# Patient Record
Sex: Male | Born: 1950 | Race: White | Hispanic: No | Marital: Married | Smoking: Former smoker
Health system: Southern US, Community
[De-identification: ages and names within clinical notes are randomized; demographics above are authoritative.]

## PROBLEM LIST (undated history)

## (undated) DIAGNOSIS — R059 Cough, unspecified: Secondary | ICD-10-CM

## (undated) DIAGNOSIS — C4490 Unspecified malignant neoplasm of skin, unspecified: Secondary | ICD-10-CM

## (undated) DIAGNOSIS — M1990 Unspecified osteoarthritis, unspecified site: Secondary | ICD-10-CM

## (undated) DIAGNOSIS — C61 Malignant neoplasm of prostate: Secondary | ICD-10-CM

## (undated) DIAGNOSIS — M2550 Pain in unspecified joint: Secondary | ICD-10-CM

## (undated) DIAGNOSIS — R001 Bradycardia, unspecified: Secondary | ICD-10-CM

## (undated) DIAGNOSIS — R05 Cough: Secondary | ICD-10-CM

## (undated) DIAGNOSIS — R361 Hematospermia: Secondary | ICD-10-CM

## (undated) DIAGNOSIS — Z86010 Personal history of colonic polyps: Secondary | ICD-10-CM

## (undated) DIAGNOSIS — E785 Hyperlipidemia, unspecified: Secondary | ICD-10-CM

## (undated) DIAGNOSIS — M25511 Pain in right shoulder: Secondary | ICD-10-CM

## (undated) HISTORY — PX: OTHER SURGICAL HISTORY: SHX169

## (undated) HISTORY — PX: PROSTATE BIOPSY: SHX241

## (undated) HISTORY — PX: APPENDECTOMY: SHX54

---

## 2001-10-31 ENCOUNTER — Emergency Department (HOSPITAL_COMMUNITY): Admission: EM | Admit: 2001-10-31 | Discharge: 2001-10-31 | Payer: Self-pay | Admitting: Emergency Medicine

## 2004-03-15 ENCOUNTER — Ambulatory Visit (HOSPITAL_COMMUNITY): Admission: RE | Admit: 2004-03-15 | Discharge: 2004-03-15 | Payer: Self-pay | Admitting: Orthopedic Surgery

## 2017-04-11 ENCOUNTER — Encounter: Payer: Self-pay | Admitting: Radiation Oncology

## 2017-04-19 ENCOUNTER — Encounter: Payer: Self-pay | Admitting: Radiation Oncology

## 2017-04-19 NOTE — Progress Notes (Signed)
GU Location of Tumor / Histology: prosatic adenocarcinoma  If Prostate Cancer, Gleason Score is (3 + 3) and PSA is (11.1). Prostate volume: 25 g.     Biopsies of prostate (if applicable) revealed:    Past/Anticipated interventions by urology, if any: biopsy, referral to radiation oncology to discuss brachytherapy  Past/Anticipated interventions by medical oncology, if any: no  Weight changes, if any: no  Bowel/Bladder complaints, if any: urinary frequency during the first hour of wakefulness. Denies dysuria, hematuria, leakage, urgency or incontinence.   Nausea/Vomiting, if any: no  Pain issues, if any:  no  SAFETY ISSUES:  Prior radiation? no  Pacemaker/ICD? no  Possible current pregnancy? no  Is the patient on methotrexate? no  Current Complaints / other details:  67 year old male. Married. Adopted. Resides in [place]. Works as a Artist (turns logs into lumber) Diagnosed 04/04/13 but repeat surveillance biopsy on 09/24/15 was negative for malignancy.

## 2017-04-25 ENCOUNTER — Ambulatory Visit
Admission: RE | Admit: 2017-04-25 | Discharge: 2017-04-25 | Disposition: A | Discharge status: Home / Self Care | Payer: Non-veteran care | Source: Ambulatory Visit | Attending: Radiation Oncology | Admitting: Radiation Oncology

## 2017-04-25 ENCOUNTER — Encounter: Payer: Self-pay | Admitting: Radiation Oncology

## 2017-04-25 VITALS — BP 120/72 | HR 50 | Resp 16 | Ht 67.0 in | Wt 199.2 lb

## 2017-04-25 DIAGNOSIS — Z510 Encounter for antineoplastic radiation therapy: Secondary | ICD-10-CM | POA: Diagnosis not present

## 2017-04-25 DIAGNOSIS — Z86010 Personal history of colonic polyps: Secondary | ICD-10-CM | POA: Diagnosis not present

## 2017-04-25 DIAGNOSIS — Z85820 Personal history of malignant melanoma of skin: Secondary | ICD-10-CM | POA: Insufficient documentation

## 2017-04-25 DIAGNOSIS — C61 Malignant neoplasm of prostate: Secondary | ICD-10-CM

## 2017-04-25 DIAGNOSIS — E785 Hyperlipidemia, unspecified: Secondary | ICD-10-CM | POA: Diagnosis not present

## 2017-04-25 DIAGNOSIS — Z87891 Personal history of nicotine dependence: Secondary | ICD-10-CM | POA: Insufficient documentation

## 2017-04-25 HISTORY — DX: Pain in unspecified joint: M25.50

## 2017-04-25 HISTORY — DX: Hyperlipidemia, unspecified: E78.5

## 2017-04-25 HISTORY — DX: Bradycardia, unspecified: R00.1

## 2017-04-25 HISTORY — DX: Pain in right shoulder: M25.511

## 2017-04-25 HISTORY — DX: Malignant neoplasm of prostate: C61

## 2017-04-25 HISTORY — DX: Personal history of colonic polyps: Z86.010

## 2017-04-25 HISTORY — DX: Unspecified malignant neoplasm of skin, unspecified: C44.90

## 2017-04-25 NOTE — Progress Notes (Signed)
Radiation Oncology         [phone] ________________________________  Initial Outpatient Consultation  Name: Lawrnce White MRN: [id number]  Date: 04/25/2017  DOB: December 22, 1950  IO:MBTDHR, Pcp Not In  Butch Penny, MD   REFERRING PHYSICIAN: Butch Penny, MD  DIAGNOSIS: 66 y.o. gentleman with Stage T1c adenocarcinoma of the prostate with Gleason Score of 3+3, and PSA of 11.1    ICD-10-CM   1. Malignant neoplasm of prostate [facility]) [place] Ambulatory referral to Urology    HISTORY OF PRESENT ILLNESS: Dominic White is a 66 y.o. male with a diagnosis of prostate cancer. He was initially diagnosed in October 2014 with low-risk, low volume disease (15%) with a PSA of 7.74 and the patient opted for active surveillance with Dr. Butch Penny at the [facility]. He had a repeat TRUSPBx on 07/30/13 which showed low grade, Gleason 6 disease but with slightly increased volume, at 28%.  His PSA increased to 8.91 06/07/16 which prompted ra repeat TRUSPBx, performed on 09/23/16.  Pathology returned benign prostatic tissue only.  Repeat PSA was 8.67 on 02/15/16 but increased to 11.1 on 02/20/2017, which prompted repeat biopsy and prostate MRI.  The patient proceeded to transrectal ultrasound with 12 biopsies of the prostate on 01/26/2017.  The prostate volume measured 25 cc.  Out of 18 core biopsies, 5 out of 9 core biopsies from the right prostate were positive with evidence of perineural invasion.  The maximum Gleason score was 3+3. MRI Pelvis was performed on 02/27/2017 and showed a 2.0 x 1.2 x 2.4 cm signal abnormality in the right posterior medial/posterior lateral peripheral zone from the apex to the base, considered PIRADS 5. While the lesion appeared to contact the capsule, there was no definite evidence of ECE or metastatic disease in the pelvis.   The patient reviewed the biopsy and imaging results with his urologist and he has kindly been referred today for discussion of potential radiation treatment  options. He is most interested in brachytherapy.   PREVIOUS RADIATION THERAPY: No  PAST MEDICAL HISTORY:  Past Medical History:  Diagnosis Date  . Hx of colonic polyps   . Hyperlipidemia   . Joint pain of leg   . Pain in right shoulder   . Prostate cancer ([place])   . Sinus bradycardia   . Skin cancer    hx of malignant melanoma/left elbow and face      PAST SURGICAL HISTORY: Past Surgical History:  Procedure Laterality Date  . APPENDECTOMY     while in seventh grade  . cyst removed from left inner ear    . PROSTATE BIOPSY      FAMILY HISTORY:  Family History  Problem Relation Age of Onset  . Adopted: Yes    SOCIAL HISTORY:  Social History   Social History  . Marital status: Married    Spouse name: N/A  . Number of children: N/A  . Years of education: N/A   Occupational History  . Not on file.   Social History Main Topics  . Smoking status: Former Smoker    Packs/day: 2.00    Years: 12.00    Types: Cigarettes    Quit date: 06/26/1974  . Smokeless tobacco: Never Used  . Alcohol use No  . Drug use: No  . Sexual activity: Yes   Other Topics Concern  . Not on file   Social History Narrative  . No narrative on file  The patient is married and has young grandchildren that live out of state.  He resides in [place]. He works as a Artist. He is adopted.  ALLERGIES: Codeine and Penicillins  MEDICATIONS:  Current Outpatient Prescriptions  Medication Sig Dispense Refill  . Carboxymethylcellulose Sod PF 0.5 % SOLN Apply to eye. Instill one drop in each eye daily.    . Cholecalciferol (VITAMIN D3) 1000 units CAPS Take by mouth.    . DOCOSAHEXAENOIC ACID PO Take 1 g by mouth.    Marland Kitchen glucosamine-chondroitin 500-400 MG tablet Take 1 tablet by mouth 3 (three) times daily.    . Multiple Vitamin (MULTIVITAMIN) tablet Take 1 tablet by mouth daily.    . naproxen (NAPROSYN) 500 MG tablet Take 500 mg by mouth 2 (two) times daily with a meal.    . Omega-3 Fatty Acids (FISH  OIL) 1000 MG CAPS Take by mouth.     No current facility-administered medications for this encounter.     REVIEW OF SYSTEMS:  On review of systems, the patient reports that he is doing well overall. He denies any chest pain, shortness of breath, cough, fevers, chills, night sweats, or unintended weight changes. He denies any bowel disturbances, and denies abdominal pain, nausea or vomiting. He denies any new musculoskeletal or joint aches or pains. His IPSS was 3, indicating mild urinary symptoms, including urinary frequency during the first hour of wakefulness. He denies any dysuria, hematuria, urgency, leakage or incontinence. He is able to complete sexual activity with all attempts. A complete review of systems is obtained and is otherwise negative.    PHYSICAL EXAM:  Wt Readings from Last 3 Encounters:  04/25/17 199 lb 3.2 oz (90.4 kg)   Temp Readings from Last 3 Encounters:  No data found for Temp   BP Readings from Last 3 Encounters:  04/25/17 120/72   Pulse Readings from Last 3 Encounters:  04/25/17 (!) 50   Pain Assessment Pain Score: 0-No pain/10  In general this is a well appearing Caucasian man in no acute distress. He is alert and oriented x4 and appropriate throughout the examination. HEENT reveals that the patient is normocephalic, atraumatic. Cardiovascular exam reveals a regular rate and rhythm, no clicks rubs or murmurs are auscultated. Chest is clear to auscultation bilaterally. Abdomen has active bowel sounds in all quadrants and is intact. The abdomen is soft, non tender, non distended.Lymphatic exam is negative for lymphadenopathy in the cervical or supraclavicular chains.  Lower extremities are negative for pretibial pitting edema, deep calf tenderness, cyanosis or clubbing.   KPS = 100  100 - Normal; no complaints; no evidence of disease. 90   - Able to carry on normal activity; minor signs or symptoms of disease. 80   - Normal activity with effort; some signs or  symptoms of disease. 62   - Cares for self; unable to carry on normal activity or to do active work. 60   - Requires occasional assistance, but is able to care for most of his personal needs. 50   - Requires considerable assistance and frequent medical care. 13   - Disabled; requires special care and assistance. 23   - Severely disabled; hospital admission is indicated although death not imminent. 38   - Very sick; hospital admission necessary; active supportive treatment necessary. 10   - Moribund; fatal processes progressing rapidly. 0     - Dead  Karnofsky DA, Abelmann WH, Craver LS and Burchenal [facility] [phone]) The use of the nitrogen mustards in the palliative treatment of carcinoma: with particular reference to bronchogenic carcinoma Cancer 1 [phone]  LABORATORY DATA:  No results found for: WBC, HGB, HCT, MCV, PLT No results found for: NA, K, CL, CO2 No results found for: ALT, AST, GGT, ALKPHOS, BILITOT   RADIOGRAPHY: No results found.    IMPRESSION/PLAN: 1. 66 y.o. gentleman with Stage T1c adenocarcinoma of the prostate with Gleason Score of 3+3, and PSA of 11.1. We discussed the patient's workup and outlined the nature of prostate cancer in this setting. The patient's T stage, Gleason's score, and PSA put him into the intermediate risk group. Accordingly, he is eligible for a variety of potential treatment options including brachytherapy, 8 weeks of external radiation or 5 weeks of external radiation followed by a brachytherapy boost. We discussed the available radiation techniques, and focused on the details of logistics and delivery. We discussed and outlined the risks, benefits, short and long-term effects associated with radiotherapy and compared and contrasted these with prostatectomy.  The patient expressed interest in prostate brachytherapy.   The patient is scheduled for repeat PSA and follow up with Dr. Amalia Hailey in January 2019. He would like to proceed with prostate brachytherapy if  his PSA remains elevated at that time. In the meantime, we will request a consultation with Dr. Nila Nephew in [place] in preparation to move forward with scheduling the procedure in the future, given that the patient wishes to proceed. The patient met briefly with Romie Jumper in our office who will be working closely with him to coordinate OR scheduling and pre and post procedure appointments.   We enjoyed meeting with him today, and will look forward to participating in the care of this very nice gentleman.  We spent 50 minutes face to face with the patient and more than 50% of that time was spent in counseling and/or coordination of care.    Nicholos Johns, PA-C    Tyler Pita, MD  [facility] Direct Dial: [phone]  Fax: [phone] [email or web address]  Skype  [email or web address]    This document serves as a record of services personally performed by Tyler Pita, MD and Brennen Caldron, PA-C. It was created on their behalf by Rae Lips, a trained medical scribe. The creation of this record is based on the scribe's personal observations and the providers' statements to them. This document has been checked and approved by the attending providers.

## 2017-04-25 NOTE — Progress Notes (Signed)
See progress note under physician encounter. 

## 2017-04-30 ENCOUNTER — Telehealth: Payer: Self-pay | Admitting: Medical Oncology

## 2017-04-30 NOTE — Telephone Encounter (Signed)
I called patient to introduce myself as the nurse navigator and my role. I was unable to meet Dominic White 10/31 when he consulted with Dr. Tammi Klippel. He is scheduled to have a PSA check in January 2019 and if it remains elevated he will be scheduled for brachytherapy. I asked him to call me with questions or concerns.

## 2017-05-07 ENCOUNTER — Telehealth: Payer: Self-pay | Admitting: *Deleted

## 2017-05-07 NOTE — Telephone Encounter (Signed)
[facility] 05-07-17 [facility] Aldona Lento

## 2017-06-08 ENCOUNTER — Encounter: Payer: Self-pay | Admitting: Urology

## 2017-06-08 NOTE — Progress Notes (Addendum)
Patient had a consult with Dr. Nila Nephew on 06/07/2017 and was felt to be a good candidate for brachytherapy. He has a follow-up appointment scheduled with Dr. Rosana Hoes in January 2019 for a repeat PSA. He is interested in moving forward with brachytherapy if his PSA remains elevated at that time. I will plan to follow-up with him in January and then will assist in making appropriate arrangements at that time if he is ready to proceed.   Nicholos Johns, PA-C

## 2017-06-22 ENCOUNTER — Telehealth: Payer: Self-pay | Admitting: *Deleted

## 2017-06-22 NOTE — Telephone Encounter (Signed)
CALLED PATIENT TO INFORM OF PRE-SEED PLANNING CT FOR 06-29-17 AND HIS IMPLANT ON 08-16-17 @ 2 PM, SPOKE WITH PATIENT AND HE IS AWARE OF THESE APPTS.

## 2017-06-28 ENCOUNTER — Telehealth: Payer: Self-pay | Admitting: *Deleted

## 2017-06-28 NOTE — Telephone Encounter (Signed)
Called patient to remind of pre-seed appts. for 06-29-17, spoke with patient and he is aware of these appts.

## 2017-06-28 NOTE — Progress Notes (Signed)
  Radiation Oncology         [phone]) [phone] ________________________________  Name: Dominic White MRN: [id number]  Date: 06/29/2017  DOB: 07-10-1950  SIMULATION AND TREATMENT PLANNING NOTE PUBIC ARCH STUDY  DZ:HGDJME, Pcp Not In  Butch Penny, MD  DIAGNOSIS: 67 y.o. gentleman with Stage T1c adenocarcinoma of the prostate with Gleason Score of 3+3, and PSA of 11.1     ICD-10-CM   1. Malignant neoplasm of prostate ([place]) C61     COMPLEX SIMULATION:  The patient presented today for evaluation for possible prostate seed implant. He was brought to the radiation planning suite and placed supine on the CT couch. A 3-dimensional image study set was obtained in upload to the planning computer. There, on each axial slice, I contoured the prostate gland. Then, using three-dimensional radiation planning tools I reconstructed the prostate in view of the structures from the transperineal needle pathway to assess for possible pubic arch interference. In doing so, I did not appreciate any pubic arch interference. Also, the patient's prostate volume was estimated based on the drawn structure. The volume was 34 cc.  Given the pubic arch appearance and prostate volume, patient remains a good candidate to proceed with prostate seed implant. Today, he freely provided informed written consent to proceed.    PLAN: The patient will undergo prostate seed implant to 145 Gy with curative intent.   ________________________________  Sheral Apley. Tammi Klippel, M.D.

## 2017-06-29 ENCOUNTER — Ambulatory Visit
Admission: RE | Admit: 2017-06-29 | Discharge: 2017-06-29 | Disposition: A | Discharge status: Home / Self Care | Payer: Non-veteran care | Source: Ambulatory Visit | Attending: Radiation Oncology | Admitting: Radiation Oncology

## 2017-06-29 ENCOUNTER — Encounter: Payer: Self-pay | Admitting: Medical Oncology

## 2017-06-29 ENCOUNTER — Encounter (HOSPITAL_COMMUNITY)
Admission: RE | Admit: 2017-06-29 | Discharge: 2017-06-29 | Disposition: A | Discharge status: Home / Self Care | Payer: Non-veteran care | Source: Ambulatory Visit | Attending: Urology | Admitting: Urology

## 2017-06-29 ENCOUNTER — Ambulatory Visit (HOSPITAL_COMMUNITY)
Admission: RE | Admit: 2017-06-29 | Discharge: 2017-06-29 | Disposition: A | Discharge status: Home / Self Care | Payer: Non-veteran care | Source: Ambulatory Visit | Attending: Urology | Admitting: Urology

## 2017-06-29 DIAGNOSIS — Z01818 Encounter for other preprocedural examination: Secondary | ICD-10-CM | POA: Diagnosis present

## 2017-06-29 DIAGNOSIS — C61 Malignant neoplasm of prostate: Secondary | ICD-10-CM

## 2017-06-29 DIAGNOSIS — Z86010 Personal history of colonic polyps: Secondary | ICD-10-CM | POA: Diagnosis not present

## 2017-06-29 DIAGNOSIS — Z87891 Personal history of nicotine dependence: Secondary | ICD-10-CM | POA: Diagnosis not present

## 2017-06-29 DIAGNOSIS — Z85820 Personal history of malignant melanoma of skin: Secondary | ICD-10-CM | POA: Diagnosis not present

## 2017-06-29 DIAGNOSIS — E785 Hyperlipidemia, unspecified: Secondary | ICD-10-CM | POA: Diagnosis not present

## 2017-06-29 DIAGNOSIS — Z510 Encounter for antineoplastic radiation therapy: Secondary | ICD-10-CM | POA: Diagnosis not present

## 2017-08-02 ENCOUNTER — Encounter (HOSPITAL_BASED_OUTPATIENT_CLINIC_OR_DEPARTMENT_OTHER): Payer: Self-pay | Admitting: *Deleted

## 2017-08-02 ENCOUNTER — Other Ambulatory Visit: Payer: Self-pay

## 2017-08-02 NOTE — Progress Notes (Addendum)
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  PATIENT REPORTS DRY COUGH X 1 DAY, PATIENT INSTRUCTED TO GO TO PRIMARY MD IF COUGH GETS WORSE OR CHEST CONGESTION OR FEVER, PATIENT REPORTS BLOOD IN SEMEN FOR LAST WEEK, PATIENT STATED HE CALLED DOCTOR AT [place] AND WAS TOLD THIS COULD BE FROM LAST PROSTATE BIOPSY, PATIENT INSTRUCTED TO CALL DOCTOR [place] AND LET HIM KNOW IF BLOOD IN SEMEN NOT CLEARED UP BY Monday 08-06-17.

## 2017-08-15 ENCOUNTER — Telehealth: Payer: Self-pay | Admitting: *Deleted

## 2017-08-15 NOTE — Telephone Encounter (Signed)
CALLED PATIENT TO REMIND OF PROCEDURE FOR 08-16-17, SPOKE WITH PATIENT AND HE IS AWARE OF THIS PROCEDURE

## 2017-08-16 ENCOUNTER — Ambulatory Visit (HOSPITAL_BASED_OUTPATIENT_CLINIC_OR_DEPARTMENT_OTHER)
Admission: RE | Admit: 2017-08-16 | Discharge: 2017-08-16 | Disposition: A | Discharge status: Home / Self Care | Payer: Non-veteran care | Source: Ambulatory Visit | Attending: Urology | Admitting: Urology

## 2017-08-16 ENCOUNTER — Ambulatory Visit (HOSPITAL_BASED_OUTPATIENT_CLINIC_OR_DEPARTMENT_OTHER): Payer: Non-veteran care | Admitting: Anesthesiology

## 2017-08-16 ENCOUNTER — Ambulatory Visit (HOSPITAL_COMMUNITY): Payer: Non-veteran care

## 2017-08-16 ENCOUNTER — Encounter (HOSPITAL_BASED_OUTPATIENT_CLINIC_OR_DEPARTMENT_OTHER): Payer: Self-pay | Admitting: *Deleted

## 2017-08-16 ENCOUNTER — Encounter (HOSPITAL_BASED_OUTPATIENT_CLINIC_OR_DEPARTMENT_OTHER)
Admission: RE | Disposition: A | Discharge status: Home / Self Care | Payer: Self-pay | Source: Ambulatory Visit | Attending: Urology

## 2017-08-16 DIAGNOSIS — Z79899 Other long term (current) drug therapy: Secondary | ICD-10-CM | POA: Diagnosis not present

## 2017-08-16 DIAGNOSIS — C61 Malignant neoplasm of prostate: Secondary | ICD-10-CM | POA: Diagnosis present

## 2017-08-16 DIAGNOSIS — E785 Hyperlipidemia, unspecified: Secondary | ICD-10-CM | POA: Diagnosis not present

## 2017-08-16 DIAGNOSIS — Z885 Allergy status to narcotic agent status: Secondary | ICD-10-CM | POA: Insufficient documentation

## 2017-08-16 DIAGNOSIS — Z880 Allergy status to penicillin: Secondary | ICD-10-CM | POA: Insufficient documentation

## 2017-08-16 DIAGNOSIS — Z85820 Personal history of malignant melanoma of skin: Secondary | ICD-10-CM | POA: Insufficient documentation

## 2017-08-16 DIAGNOSIS — Z87891 Personal history of nicotine dependence: Secondary | ICD-10-CM | POA: Diagnosis not present

## 2017-08-16 DIAGNOSIS — Z01818 Encounter for other preprocedural examination: Secondary | ICD-10-CM

## 2017-08-16 HISTORY — PX: RADIOACTIVE SEED IMPLANT: SHX5150

## 2017-08-16 HISTORY — PX: SPACE OAR INSTILLATION: SHX6769

## 2017-08-16 HISTORY — DX: Unspecified osteoarthritis, unspecified site: M19.90

## 2017-08-16 HISTORY — PX: CYSTOSCOPY: SHX5120

## 2017-08-16 HISTORY — DX: Cough, unspecified: R05.9

## 2017-08-16 HISTORY — DX: Hematospermia: R36.1

## 2017-08-16 HISTORY — DX: Cough: R05

## 2017-08-16 SURGERY — INSERTION, RADIATION SOURCE, PROSTATE
Anesthesia: General

## 2017-08-16 MED ORDER — FENTANYL CITRATE (PF) 100 MCG/2ML IJ SOLN
INTRAMUSCULAR | Status: AC
  Filled 2017-08-16: qty 2

## 2017-08-16 MED ORDER — PROMETHAZINE HCL 25 MG/ML IJ SOLN
6.2500 mg | INTRAMUSCULAR | Status: DC | PRN
  Filled 2017-08-16: qty 1

## 2017-08-16 MED ORDER — KETOROLAC TROMETHAMINE 30 MG/ML IJ SOLN
INTRAMUSCULAR | Status: DC | PRN
  Administered 2017-08-16: 30 mg via INTRAVENOUS

## 2017-08-16 MED ORDER — DEXAMETHASONE SODIUM PHOSPHATE 10 MG/ML IJ SOLN
INTRAMUSCULAR | Status: AC
Start: 2017-08-16 — End: 2017-08-16
  Filled 2017-08-16: qty 1

## 2017-08-16 MED ORDER — ONDANSETRON HCL 4 MG/2ML IJ SOLN
INTRAMUSCULAR | Status: AC
  Filled 2017-08-16: qty 2

## 2017-08-16 MED ORDER — PROPOFOL 10 MG/ML IV BOLUS
INTRAVENOUS | Status: AC
  Filled 2017-08-16: qty 20

## 2017-08-16 MED ORDER — PHENYLEPHRINE 40 MCG/ML (10ML) SYRINGE FOR IV PUSH (FOR BLOOD PRESSURE SUPPORT)
PREFILLED_SYRINGE | INTRAVENOUS | Status: AC
Start: 2017-08-16 — End: 2017-08-16
  Filled 2017-08-16: qty 10

## 2017-08-16 MED ORDER — FENTANYL CITRATE (PF) 100 MCG/2ML IJ SOLN
25.0000 ug | INTRAMUSCULAR | Status: DC | PRN
  Administered 2017-08-16: 25 ug via INTRAVENOUS
  Filled 2017-08-16: qty 1

## 2017-08-16 MED ORDER — CIPROFLOXACIN IN D5W 400 MG/200ML IV SOLN
INTRAVENOUS | Status: AC
  Filled 2017-08-16: qty 200

## 2017-08-16 MED ORDER — LACTATED RINGERS IV SOLN
INTRAVENOUS | Status: DC
  Administered 2017-08-16 (×2): via INTRAVENOUS
  Filled 2017-08-16: qty 1000

## 2017-08-16 MED ORDER — OXYCODONE HCL 5 MG/5ML PO SOLN
5.0000 mg | Freq: Once | ORAL | Status: DC | PRN
  Filled 2017-08-16: qty 5

## 2017-08-16 MED ORDER — CIPROFLOXACIN IN D5W 400 MG/200ML IV SOLN
400.0000 mg | INTRAVENOUS | Status: AC
  Administered 2017-08-16: 400 mg via INTRAVENOUS
  Filled 2017-08-16: qty 200

## 2017-08-16 MED ORDER — EPHEDRINE 5 MG/ML INJ
INTRAVENOUS | Status: AC
  Filled 2017-08-16: qty 10

## 2017-08-16 MED ORDER — DEXAMETHASONE SODIUM PHOSPHATE 10 MG/ML IJ SOLN
INTRAMUSCULAR | Status: DC | PRN
  Administered 2017-08-16: 10 mg via INTRAVENOUS

## 2017-08-16 MED ORDER — LIDOCAINE 2% (20 MG/ML) 5 ML SYRINGE
INTRAMUSCULAR | Status: AC
  Filled 2017-08-16: qty 5

## 2017-08-16 MED ORDER — EPHEDRINE SULFATE 50 MG/ML IJ SOLN
INTRAMUSCULAR | Status: DC | PRN
  Administered 2017-08-16: 10 mg via INTRAVENOUS
  Administered 2017-08-16: 5 mg via INTRAVENOUS
  Administered 2017-08-16 (×2): 10 mg via INTRAVENOUS

## 2017-08-16 MED ORDER — MIDAZOLAM HCL 2 MG/2ML IJ SOLN
INTRAMUSCULAR | Status: AC
  Filled 2017-08-16: qty 2

## 2017-08-16 MED ORDER — ONDANSETRON HCL 4 MG/2ML IJ SOLN
INTRAMUSCULAR | Status: DC | PRN
  Administered 2017-08-16: 4 mg via INTRAVENOUS

## 2017-08-16 MED ORDER — FENTANYL CITRATE (PF) 100 MCG/2ML IJ SOLN
25.0000 ug | INTRAMUSCULAR | Status: DC | PRN
  Filled 2017-08-16: qty 1

## 2017-08-16 MED ORDER — PROPOFOL 10 MG/ML IV BOLUS
INTRAVENOUS | Status: DC | PRN
  Administered 2017-08-16: 200 mg via INTRAVENOUS

## 2017-08-16 MED ORDER — OXYCODONE HCL 5 MG PO TABS
5.0000 mg | ORAL_TABLET | Freq: Once | ORAL | Status: DC | PRN
  Filled 2017-08-16: qty 1

## 2017-08-16 MED ORDER — ONDANSETRON HCL 4 MG/2ML IJ SOLN
4.0000 mg | Freq: Once | INTRAMUSCULAR | Status: DC | PRN
  Filled 2017-08-16: qty 2

## 2017-08-16 MED ORDER — LIDOCAINE 2% (20 MG/ML) 5 ML SYRINGE
INTRAMUSCULAR | Status: DC | PRN
  Administered 2017-08-16: 100 mg via INTRAVENOUS

## 2017-08-16 MED ORDER — FLEET ENEMA 7-19 GM/118ML RE ENEM
1.0000 | ENEMA | Freq: Once | RECTAL | Status: AC
  Administered 2017-08-16: 1 via RECTAL
  Filled 2017-08-16: qty 1

## 2017-08-16 MED ORDER — KETOROLAC TROMETHAMINE 30 MG/ML IJ SOLN
30.0000 mg | Freq: Once | INTRAMUSCULAR | Status: DC | PRN
  Filled 2017-08-16: qty 1

## 2017-08-16 MED ORDER — SODIUM CHLORIDE 0.9 % IJ SOLN
INTRAMUSCULAR | Status: DC | PRN
  Administered 2017-08-16: 10 mL

## 2017-08-16 MED ORDER — IOHEXOL 300 MG/ML  SOLN
INTRAMUSCULAR | Status: DC | PRN
  Administered 2017-08-16: 5 mL

## 2017-08-16 MED ORDER — MIDAZOLAM HCL 2 MG/2ML IJ SOLN
INTRAMUSCULAR | Status: DC | PRN
  Administered 2017-08-16: 2 mg via INTRAVENOUS

## 2017-08-16 MED ORDER — FENTANYL CITRATE (PF) 100 MCG/2ML IJ SOLN
INTRAMUSCULAR | Status: DC | PRN
  Administered 2017-08-16 (×3): 25 ug via INTRAVENOUS

## 2017-08-16 MED FILL — levoFLOXacin 500 MG TABS: 500 | 6 days supply | Qty: 3 | Fill #0

## 2017-08-16 MED FILL — HYDROmorphone HCL 2 MG TABS: 2 | 2 days supply | Qty: 10 | Fill #0

## 2017-08-16 MED FILL — TAMSULOSIN HCL 0.4 MG CAP: 0.4 | 30 days supply | Qty: 30 | Fill #0

## 2017-08-16 SURGICAL SUPPLY — 25 items
BLADE CLIPPER SURG (BLADE) ×4 IMPLANT
CATH FOLEY 2WAY SLVR  5CC 16FR (CATHETERS) ×4
CATH FOLEY 2WAY SLVR 5CC 16FR (CATHETERS) ×4 IMPLANT
CATH ROBINSON RED A/P 20FR (CATHETERS) ×4 IMPLANT
CLOTH BEACON ORANGE TIMEOUT ST (SAFETY) ×4 IMPLANT
COVER BACK TABLE 60X90IN (DRAPES) ×4 IMPLANT
COVER MAYO STAND STRL (DRAPES) ×4 IMPLANT
DRSG TEGADERM 4X4.75 (GAUZE/BANDAGES/DRESSINGS) ×4 IMPLANT
DRSG TEGADERM 8X12 (GAUZE/BANDAGES/DRESSINGS) ×4 IMPLANT
GAUZE SPONGE 4X4 8PLY STR LF (GAUZE/BANDAGES/DRESSINGS) ×4 IMPLANT
GLOVE ECLIPSE 8.0 STRL XLNG CF (GLOVE) ×12 IMPLANT
GLOVE SURG SIGNA 7.5 PF LTX (GLOVE) ×4 IMPLANT
GOWN SPEC L3 MED W/TWL (GOWN DISPOSABLE) ×4 IMPLANT
HOLDER FOLEY CATH W/STRAP (MISCELLANEOUS) ×4 IMPLANT
IMPL SPACEOAR SYSTEM 10ML (MISCELLANEOUS) ×2 IMPLANT
IMPLANT SPACEOAR SYSTEM 10ML (MISCELLANEOUS) ×4
IV NS 1000ML (IV SOLUTION) ×2
IV NS 1000ML BAXH (IV SOLUTION) ×2 IMPLANT
KIT RM TURNOVER CYSTO AR (KITS) ×4 IMPLANT
PACK CYSTO (CUSTOM PROCEDURE TRAY) ×4 IMPLANT
SUT BONE WAX W31G (SUTURE) ×4 IMPLANT
SYRINGE 10CC LL (SYRINGE) ×8 IMPLANT
UNDERPAD 30X30 INCONTINENT (UNDERPADS AND DIAPERS) ×8 IMPLANT
WATER STERILE IRR 500ML POUR (IV SOLUTION) ×4 IMPLANT
selectSeed I-125 ×4 IMPLANT

## 2017-08-16 NOTE — Anesthesia Postprocedure Evaluation (Signed)
Anesthesia Post Note  Patient: Dominic White  Procedure(s) Performed: RADIOACTIVE SEED IMPLANT/BRACHYTHERAPY IMPLANT (N/A ) CYSTOSCOPY SPACE OAR INSTILLATION     Patient location during evaluation: PACU Anesthesia Type: General Level of consciousness: awake and alert Pain management: pain level controlled Vital Signs Assessment: post-procedure vital signs reviewed and stable Respiratory status: spontaneous breathing, nonlabored ventilation, respiratory function stable and patient connected to nasal cannula oxygen Cardiovascular status: blood pressure returned to baseline and stable Postop Assessment: no apparent nausea or vomiting Anesthetic complications: no    Last Vitals:  Vitals:   08/16/17 1630 08/16/17 1640  BP: 139/78   Pulse: 67 65  Resp: 16 18  Temp:    SpO2: 97% 94%    Last Pain:  Vitals:   08/16/17 1609  TempSrc:   PainSc: Asleep                 Jaken Fregia S

## 2017-08-16 NOTE — Anesthesia Preprocedure Evaluation (Addendum)
Anesthesia Evaluation  Patient identified by MRN, date of birth, ID band Patient awake    Reviewed: Allergy & Precautions, NPO status , Patient's Chart, lab work & pertinent test results  Airway Mallampati: II  TM Distance: >3 FB Neck ROM: Full    Dental  (+) Dental Advisory Given, Missing, Teeth Intact,    Pulmonary former smoker,    Pulmonary exam normal breath sounds clear to auscultation       Cardiovascular negative cardio ROS Normal cardiovascular exam Rhythm:Regular Rate:Normal     Neuro/Psych negative neurological ROS  negative psych ROS   GI/Hepatic negative GI ROS, Neg liver ROS,   Endo/Other  Obesity  Renal/GU negative Renal ROS   Prostate cancer    Musculoskeletal  (+) Arthritis ,   Abdominal   Peds  Hematology negative hematology ROS (+)   Anesthesia Other Findings   Reproductive/Obstetrics                            Anesthesia Physical Anesthesia Plan  ASA: II  Anesthesia Plan: General   Post-op Pain Management:    Induction: Intravenous  PONV Risk Score and Plan: 3 and Treatment may vary due to age or medical condition, Dexamethasone and Ondansetron  Airway Management Planned: LMA  Additional Equipment: None  Intra-op Plan:   Post-operative Plan: Extubation in OR  Informed Consent: I have reviewed the patients History and Physical, chart, labs and discussed the procedure including the risks, benefits and alternatives for the proposed anesthesia with the patient or authorized representative who has indicated his/her understanding and acceptance.   Dental advisory given  Plan Discussed with: CRNA  Anesthesia Plan Comments:         Anesthesia Quick Evaluation

## 2017-08-16 NOTE — Transfer of Care (Signed)
Immediate Anesthesia Transfer of Care Note  Patient: Dominic White  Procedure(s) Performed: RADIOACTIVE SEED IMPLANT/BRACHYTHERAPY IMPLANT (N/A ) CYSTOSCOPY SPACE OAR INSTILLATION  Patient Location: PACU  Anesthesia Type:General  Level of Consciousness: awake, alert , oriented and patient cooperative  Airway & Oxygen Therapy: Patient Spontanous Breathing and Patient connected to nasal cannula oxygen  Post-op Assessment: Report given to RN and Post -op Vital signs reviewed and stable  Post vital signs: Reviewed and stable  Last Vitals:  Vitals:   08/16/17 1200 08/16/17 1609  BP: 117/66   Pulse: (!) 49 (P) 74  Resp: 16   Temp: 36.8 C (P) 36.6 C  SpO2: 99% (P) 96%    Last Pain:  Vitals:   08/16/17 1200  TempSrc: Oral      Patients Stated Pain Goal: 7 (55/37/48 2707)  Complications: No apparent anesthesia complications

## 2017-08-16 NOTE — Interval H&P Note (Signed)
History and Physical Interval Note:  08/16/2017 2:32 PM  Dominic White  has presented today for surgery, with the diagnosis of prostate cancer  The various methods of treatment have been discussed with the patient and family. After consideration of risks, benefits and other options for treatment, the patient has consented to  Procedure(s) with comments: RADIOACTIVE SEED IMPLANT/BRACHYTHERAPY IMPLANT (N/A) -  seeds implanted CYSTOSCOPY - no seeds noted in bladder  as a surgical intervention .  The patient's history has been reviewed, patient examined, no change in status, stable for surgery.  I have reviewed the patient's chart and labs.  Questions were answered to the patient's satisfaction.     Merelyn Klump

## 2017-08-16 NOTE — H&P (Signed)
NAME:  Dominic White, Dominic White NO.:  MEDICAL RECORD NO.:  [id number]  LOCATION:                                 FACILITY:  PHYSICIAN:  Joie Bimler, M.D.          DATE OF BIRTH:  DATE OF ADMISSION: DATE OF DISCHARGE:                             HISTORY & PHYSICAL   For procedure on August 16, 2017.  The patient is a 67 year old white male with a history of prostate cancer.  He was initially diagnosed in October 2014 with low volume disease and was monitored.  Over the ensuing years, he had a slowly rising PSA such that a repeat PSA in 2018 had increased to 11.1.  This prompted a repeat biopsy and prostate MRI.  The patient was found to have 5/9 core biopsies from the right prostate that were positive. Maximum Gleason score was 3+3.  MRI of the pelvis was performed and it showed a lesion in the right posteromedial, posterolateral peripheral zone.  There was no evidence of extracapsular extension or metastatic disease in the pelvis.  The patient has reviewed his options and has opted for brachytherapy for definitive treatment of the prostate cancer.  PAST MEDICAL HISTORY:  Significant for history of colonic polyps. History of hyperlipidemia.  Degenerative joint disease.  He has a history of malignant melanoma of the left elbow and face.  Status post excision.  He had previous appendectomy many years ago.  SOCIAL HISTORY:  He is a former smoker.  He smoked 2 packs per day for 12 years; he stopped in 1976.  ALLERGIES:  He is allergic to: 1. CODEINE. 2. PENICILLIN.  CURRENT MEDICATIONS:  Re listed in the chart and include: 1. Carboxymethylcellulose eye drops. 2. Vitamin D3. 3. Glucosamine. 4. Multivitamins. 5. Naproxen p.r.n. 6. Omega-3 fatty acid capsular supplement.  FAMILY HISTORY:  Not significant for any history of prostate cancer.  REVIEW OF SYSTEMS:  Shows no history of chest pain, shortness of breath, cough, fever, or chills.  He has maintained  his weight.  PHYSICAL EXAMINATION:  GENERAL:  Well-developed, well-nourished man, in no acute distress. HEAD:  Normocephalic. NECK:  Supple without masses.  No lymphadenopathy palpable in the supraclavicular area. LUNGS:  Clear to auscultation. HEART:  Sounds regular rate and rhythm. ABDOMEN:  Soft, nontender.  Mild distention. GU:  The penis is without lesions.  Both testes descended. RECTAL:  Reveals a 25-g prostate.  Mild firmness at the right base. Adequate sphincter tone. EXTREMITIES:  Without clubbing or edema.  No calf tenderness noted. NEUROLOGICAL:  Nonfocal.  LABORATORY AND IMAGING DATA:  Pertinent laboratories are pending, include lab data.  Chest x-ray is negative for any infiltrate.  IMPRESSION:  Stage T1c to T2 adenocarcinoma of the prostate.  PLAN:  Options have been discussed for definitive treatment and the patient has opted for radioactive seed implantation for therapy.  The risks and benefits have been discussed and he is willing to proceed. Questions have been answered to his satisfaction.          ______________________________ Joie Bimler, M.D.     RC/MEDQ  D:  08/15/2017  T:  08/16/2017  Job:  [id number] 

## 2017-08-16 NOTE — Anesthesia Procedure Notes (Signed)
Procedure Name: LMA Insertion Date/Time: 08/16/2017 2:33 PM Performed by: Wanita Chamberlain, CRNA Pre-anesthesia Checklist: Patient identified, Emergency Drugs available, Suction available, Patient being monitored and Timeout performed Patient Re-evaluated:Patient Re-evaluated prior to induction Oxygen Delivery Method: Circle system utilized Preoxygenation: Pre-oxygenation with 100% oxygen Induction Type: IV induction Ventilation: Mask ventilation without difficulty LMA: LMA inserted LMA Size: 4.0 Number of attempts: 1 Placement Confirmation: breath sounds checked- equal and bilateral,  CO2 detector and positive ETCO2 Tube secured with: Tape Dental Injury: Teeth and Oropharynx as per pre-operative assessment

## 2017-08-16 NOTE — Brief Op Note (Signed)
08/16/2017  4:20 PM  PATIENT:  Dominic White  67 y.o. male  PRE-OPERATIVE DIAGNOSIS:  prostate cancer  POST-OPERATIVE DIAGNOSIS:  prostate cancer  PROCEDURE:  Procedure(s) with comments: RADIOACTIVE SEED IMPLANT/BRACHYTHERAPY IMPLANT (N/A) - 66 seeds implanted CYSTOSCOPY - no seeds noted in bladder  SPACE OAR INSTILLATION  SURGEON:  Surgeon(s) and Role:    Joie Bimler, MD - Primary    * Tyler Pita, MD - Assisting  PHYSICIAN ASSISTANT:   ASSISTANTS: none   ANESTHESIA:   general  EBL:  5 mL   BLOOD ADMINISTERED:none  DRAINS: Urinary Catheter (Foley)   LOCAL MEDICATIONS USED:  NONE  SPECIMEN:  No Specimen  DISPOSITION OF SPECIMEN:  N/A  COUNTS:  YES  TOURNIQUET:  * No tourniquets in log *  DICTATION: .#[id number]  PLAN OF CARE: Discharge to home after PACU  PATIENT DISPOSITION:  PACU - hemodynamically stable.   Delay start of Pharmacological VTE agent (>24hrs) due to surgical blood loss or risk of bleeding: yes

## 2017-08-16 NOTE — Discharge Instructions (Signed)
Radioactive Seed Implant Home Care Instructions   Activity:    Rest for the remainder of the day.  Do not drive or operate equipment today.  You may resume normal activities in a few days as instructed by your physician, without risk of harmful radiation exposure to those around you, provided you follow the time and distance precautions on the Radiation Oncology Instruction Sheet.   Meals: Drink plenty of lipuids and eat light foods, such as gelatin or soup this evening .  You may return to normal meal plan tomorrow.  Return To Work: You may return to work as instructed by [employer].  Special Instruction:   If any seeds are found, use tweezers to pick up seeds and place in a glass container of any kind and bring to your physician's office.  Call your physician if any of these symptoms occur:   Persistent or heavy bleeding  Urine stream diminishes or stops completely after catheter is removed  Fever equal to or greater than 101 degrees F  Cloudy urine with a strong foul odor  Severe pain  You may feel some burning pain and/or hesitancy when you urinate after the catheter is removed.  These symptoms may increase over the next few weeks, but should diminish within forur to six weeks.  Applying moist heat to the lower abdomen or a hot tub bath may help relieve the pain.  If the discomfort becomes severe, please call your physician for additional medications.  To Dr Ara Kussmaul office tomorrow for catheter removal  Post Anesthesia Home Care Instructions  Activity: Get plenty of rest for the remainder of the day. A responsible individual must stay with you for 24 hours following the procedure.  For the next 24 hours, DO NOT: -Drive a car -Paediatric nurse -Drink alcoholic beverages -Take any medication unless instructed by your physician -Make any legal decisions or sign important papers.  Meals: Start with liquid foods such as gelatin or soup. Progress to regular foods as  tolerated. Avoid greasy, spicy, heavy foods. If nausea and/or vomiting occur, drink only clear liquids until the nausea and/or vomiting subsides. Call your physician if vomiting continues.  Special Instructions/Symptoms: Your throat may feel dry or sore from the anesthesia or the breathing tube placed in your throat during surgery. If this causes discomfort, gargle with warm salt water. The discomfort should disappear within 24 hours.  If you had a scopolamine patch placed behind your ear for the management of post- operative nausea and/or vomiting:  1. The medication in the patch is effective for 72 hours, after which it should be removed.  Wrap patch in a tissue and discard in the trash. Wash hands thoroughly with soap and water. 2. You may remove the patch earlier than 72 hours if you experience unpleasant side effects which may include dry mouth, dizziness or visual disturbances. 3. Avoid touching the patch. Wash your hands with soap and water after contact with the patch.

## 2017-08-17 ENCOUNTER — Encounter (HOSPITAL_BASED_OUTPATIENT_CLINIC_OR_DEPARTMENT_OTHER): Payer: Self-pay | Admitting: Urology

## 2017-08-17 NOTE — Op Note (Signed)
NAME:  JAHSHUA, BONITO NO.:  MEDICAL RECORD NO.:  [id number]  LOCATION:                                 FACILITY:  PHYSICIAN:  Joie Bimler, M.D.          DATE OF BIRTH:  DATE OF PROCEDURE:  08/16/2017 DATE OF DISCHARGE:                              OPERATIVE REPORT   PREOPERATIVE DIAGNOSIS:  Stage T1c adenocarcinoma of the prostate.  POSTOPERATIVE DIAGNOSIS:  Stage T1c adenocarcinoma of the prostate.  PROCEDURE:  Transrectal ultrasound of the prostate and intraprostatic seed implantation of I-125 seeds, perirectal spacer implant and cystoscopy.  SURGEON:  Joie Bimler, M.D.  CO-SURGEON:  Lora Paula, M.D.  ANESTHESIA:  General endotracheal.  INDICATION FOR PROCEDURE:  The patient is a 67 year old white male with recent diagnosis of prostate cancer involving his right lobe of the prostate.  Staging studies show only localized disease and he has opted for radioactive seed implantation for definitive therapy.  DESCRIPTION OF PROCEDURE:  The patient was brought into the operating room and placed on the table in the supine position.  After adequate general anesthesia was achieved, a Foley catheter was placed and the patient was then carefully placed in exaggerated lithotomy with the Yellofin stirrups.  The scrotum was then elevated and taped onto the abdomen.  The perineum was shaved, prepped and draped for the performance of the procedure.  A red rubber catheter was then placed into the rectum to release excess air.  Transrectal ultrasound probe was then placed and prostatic anchors placed.  The prostate was then scanned from apex to seminal vesicles with good visualization of the gland.  The images were scanned into the Nucletron program.  The individual images were then reviewed by myself, Dr. Tammi Klippel and the physicist.  The borders of the prostate, urethra and rectum were marked.  The computer program then calculated the appropriate seed  placement.  All the individual images were then reviewed to ascertain good seed placement and appropriate changes were made.  Once we had good coverage of the prostate with the sparing of the urethra and rectum, seed implantation was begun.  The bladder neck was identified with contrast in the Foley balloon.  Needles were then placed from anterior to posterior on the prostate to encompass the prostate in its entirety.  No portion of the prostate was left untreated.  Once the final seeds were placed, the anchor needles were removed and pelvic x-ray was obtained showing a good distribution of the seeds.  Ultrasound was maintained and this was used to guide the needle into the perirectal space between the prostate and rectum into the fatty tissue.  Once localized, a saline test dose of 2-3 cc was injected into the plane, which showed good expansion of the plan. The spacer formula was then injected into the potential space to provide a barrier between the prostate and rectum.  A total of 20 mL of the spacer was able to be injected into the area.  The needle was removed. Foley catheter was then removed.  The patient was re-prepped and draped, and flexible cystoscopy was then performed.  This showed no needles  within the bladder.  Under sterile conditions, a new 77- [place] Foley was placed to drain the bladder.  A rectal exam was performed and this did not reveal any foreign bodies in the rectum and the spacer area was palpable.  The patient tolerated all this well.  He was awakened and taken to the recovery room in good condition.          ______________________________ Joie Bimler, M.D.     RC/MEDQ  D:  08/16/2017  T:  08/16/2017  Job:  [id number]

## 2017-08-18 NOTE — Progress Notes (Signed)
  Radiation Oncology         [phone] ________________________________  Name: JANN RA MRN: [id number]  Date: 08/18/2017  DOB: [birth year]       Prostate Seed Implant  JA:SNKNLZ, [facility]  No ref. provider found  DIAGNOSIS: 67 y.o. gentleman with Stage T1c adenocarcinoma of the prostate with Gleason Score of 3+3, and PSA of 11.1     ICD-10-CM   1. Pre-op testing Z01.818 DG Chest 2 View    DG Chest 2 View    PROCEDURE: Insertion of radioactive I-125 seeds into the prostate gland.  RADIATION DOSE: 145 Gy, definitive therapy.  TECHNIQUE: DELON REVELO was brought to the operating room with the urologist. He was placed in the dorsolithotomy position. He was catheterized and a rectal tube was inserted. The perineum was shaved, prepped and draped. The ultrasound probe was then introduced into the rectum to see the prostate gland.  TREATMENT DEVICE: A needle grid was attached to the ultrasound probe stand and anchor needles were placed.  3D PLANNING: The prostate was imaged in 3D using a sagittal sweep of the prostate probe. These images were transferred to the planning computer. There, the prostate, urethra and rectum were defined on each axial reconstructed image. Then, the software created an optimized 3D plan and a few seed positions were adjusted. The quality of the plan was reviewed using [facility] information for the target and the following two organs at risk:  Urethra and Rectum.  Then the accepted plan was uploaded to the seed Selectron afterloading unit.  PROSTATE VOLUME STUDY:  Using transrectal ultrasound the volume of the prostate was verified to be 35 cc.  SPECIAL TREATMENT PROCEDURE/SUPERVISION AND HANDLING: The Nucletron FIRST system was used to place the needles under sagittal guidance. A total of 22 needles were used to deposit 66 seeds in the prostate gland. The individual seed activity was 0.427 mCi.  SpaceOAR:  Yes  COMPLEX SIMULATION: At the end of the  procedure, an anterior radiograph of the pelvis was obtained to document seed positioning and count. Cystoscopy was performed to check the urethra and bladder.  MICRODOSIMETRY: At the end of the procedure, the patient was emitting 0.153 mR/hr at 1 meter. Accordingly, he was considered safe for hospital discharge.  PLAN: The patient will return to the radiation oncology clinic for post implant CT dosimetry in three weeks.   ________________________________  Sheral Apley Tammi Klippel, M.D.

## 2017-08-28 ENCOUNTER — Other Ambulatory Visit: Payer: Self-pay | Admitting: Urology

## 2017-08-28 DIAGNOSIS — C61 Malignant neoplasm of prostate: Secondary | ICD-10-CM

## 2017-08-29 ENCOUNTER — Telehealth: Payer: Self-pay | Admitting: *Deleted

## 2017-08-29 NOTE — Telephone Encounter (Signed)
CALLED PATIENT TO INFORM OF APPTS. FOR 08-31-17, LVM FOR A RETURN CALL

## 2017-08-30 ENCOUNTER — Telehealth: Payer: Self-pay | Admitting: *Deleted

## 2017-08-30 NOTE — Telephone Encounter (Signed)
CALLED PATIENT TO REMIND OF POST SEED APPTS FOR 08-31-17, SPOKE WITH PATIENT AND HE IS AWARE OF THESE APPTS.

## 2017-08-31 ENCOUNTER — Ambulatory Visit (HOSPITAL_COMMUNITY)
Admission: RE | Admit: 2017-08-31 | Discharge: 2017-08-31 | Disposition: A | Discharge status: Home / Self Care | Payer: Non-veteran care | Source: Ambulatory Visit | Attending: Urology | Admitting: Urology

## 2017-08-31 ENCOUNTER — Ambulatory Visit
Admission: RE | Admit: 2017-08-31 | Discharge: 2017-08-31 | Disposition: A | Discharge status: Home / Self Care | Payer: Non-veteran care | Source: Ambulatory Visit | Attending: Radiation Oncology | Admitting: Radiation Oncology

## 2017-08-31 ENCOUNTER — Encounter: Payer: Self-pay | Admitting: Radiation Oncology

## 2017-08-31 ENCOUNTER — Other Ambulatory Visit: Payer: Self-pay

## 2017-08-31 VITALS — BP 125/72 | HR 62 | Resp 18

## 2017-08-31 DIAGNOSIS — C61 Malignant neoplasm of prostate: Secondary | ICD-10-CM | POA: Insufficient documentation

## 2017-08-31 DIAGNOSIS — Z885 Allergy status to narcotic agent status: Secondary | ICD-10-CM | POA: Diagnosis not present

## 2017-08-31 DIAGNOSIS — Z510 Encounter for antineoplastic radiation therapy: Secondary | ICD-10-CM | POA: Insufficient documentation

## 2017-08-31 DIAGNOSIS — Z923 Personal history of irradiation: Secondary | ICD-10-CM | POA: Diagnosis not present

## 2017-08-31 DIAGNOSIS — Z880 Allergy status to penicillin: Secondary | ICD-10-CM | POA: Diagnosis not present

## 2017-08-31 NOTE — Progress Notes (Signed)
  Radiation Oncology         [phone] ________________________________  Name: Dominic White MRN: [id number]  Date: 08/31/2017  DOB: Dec 04, 1950  COMPLEX SIMULATION NOTE  NARRATIVE:  The patient was brought to the [place] suite today following prostate seed implantation approximately one month ago.  Identity was confirmed.  All relevant records and images related to the planned course of therapy were reviewed.  Then, the patient was set-up supine.  CT images were obtained.  The CT images were loaded into the planning software.  Then the prostate and rectum were contoured.  Treatment planning then occurred.  The implanted iodine 125 seeds were identified by the physics staff for projection of radiation distribution  I have requested : 3D Simulation  I have requested a DVH of the following structures: Prostate and rectum.    ________________________________  Sheral Apley Tammi Klippel, M.D.  This document serves as a record of services personally performed by Tyler Pita MD. It was created on his behalf by Delton Coombes, a trained medical scribe. The creation of this record is based on the scribe's personal observations and the provider's statements to them.

## 2017-08-31 NOTE — Progress Notes (Signed)
Weight and vitals stable. Denies pain. Pre seed IPSS 3. Post seed IPSS 9. Reports Nila Nephew removed his urinary catheter the day after surgery and is scheduled to follow up with him again on the 22nd of March. MRI to confirm SpaceOar placement is scheduled for 5 pm. Denies dysuria, hematuria, leakage or incontinence.   BP 125/72 (BP Location: Left Arm, Patient Position: Sitting, Cuff Size: Normal)   Pulse 62   Resp 18   SpO2 100%

## 2017-08-31 NOTE — Progress Notes (Signed)
Radiation Oncology         [phone] ________________________________  Name: Dominic White MRN: [id number]  Date: 08/31/2017  DOB: [birth year]  Post-Seed Follow-Up Visit Note  CC: Clinic, Eulogio Ditch, MD  Diagnosis:   67 y.o. gentleman with Stage T1c adenocarcinoma of the prostate with Gleason Score of 3+3, and PSA of 11.1     ICD-10-CM   1. Malignant neoplasm of prostate (HCC) C61     Interval Since Last Radiation:  2 weeks Insertion of radioactive I-125 seeds into the prostate gland; 145 Gy, definitive therapy with placement of SpaceOAR.  Narrative:  The patient returns today for routine follow-up.  He is complaining of increased urinary frequency and urinary hesitation symptoms. He filled out a questionnaire regarding urinary function today providing and overall IPSS score of 9 characterizing his symptoms as moderate.  His pre-implant score was 3.  He reports gradual improvement in the lower urinary tract symptoms.  He specifically denies dysuria, gross hematuria, excessive frequency, incomplete emptying or incontinence.  He is taking Flomax daily.  He denies  abdominal pain, nausea, vomiting or diarrhea.  He reports a healthy appetite and is maintaining his weight.  He has not noted any significant impact on his energy level. Overall he is pleased with his progress to date.  ALLERGIES:  is allergic to codeine and penicillins.  Meds: Current Outpatient Medications  Medication Sig Dispense Refill  . Carboxymethylcellulose Sod PF 0.5 % SOLN Apply to eye. Instill one drop in each eye daily.    . Multiple Vitamin (MULTI-VITAMINS) TABS Take by mouth.    . tamsulosin (FLOMAX) 0.4 MG CAPS capsule   0  . UNABLE TO FIND TYLENOL COLD AND FLU AS NEEDED     No current facility-administered medications for this encounter.     Physical Findings: In general this is a well appearing Caucasian male in no acute distress.  He's alert and oriented x4 and appropriate  throughout the examination. Cardiopulmonary assessment is negative for acute distress and he exhibits normal effort.   Lab Findings: No results found for: WBC, HGB, HCT, MCV, PLT  Radiographic Findings:  Patient underwent CT imaging in our clinic for post implant dosimetry. The CT was reviewed by Dr. Tammi Klippel and appears to demonstrate an adequate distribution of radioactive seeds throughout the prostate gland. There are no seeds in or near the rectum. We suspect the final radiation plan and dosimetry will show appropriate coverage of the prostate gland.   Impression/Plan:   1.  67 y.o. gentleman with Stage T1c adenocarcinoma of the prostate with Gleason Score of 3+3, and PSA of 11.1.  The patient is recovering well from the effects radiation. He is reassured that his urinary symptoms should continue to gradually improve over the next 4-6 months. He is encouraged by his improvement already and is otherwise pleased with his outcome.  Today we spent time talking to the patient about his prostate seed implant and resolving urinary symptoms. We also talked about long-term follow-up of prostate cancer following seed implant. He understands that ongoing PSA determinations and digital rectal exams will help perform surveillance to rule out disease recurrence.  He has a follow-up visit scheduled with Dr. Nila Nephew at the end of March.  He understands what to expect with his PSA measures going forward and this will be monitored through his urologist at the [place]. He was also educated about some of the long-term effects from radiation including a small risk for rectal bleeding and  possible erectile dysfunction. We talked about some of the general management approaches to these potential complications. His post-implant CT images were reviewed by Dr. Tammi Klippel and appear to have excellent coverage.  A copy of the detailed report will be forwarded to his urologist.  He is encouraged to contact our office or return at any point  if he has any questions or concerns related to his previous radiation or prostate cancer. We look forward to continuing to follow his response to treatment through urology correspondence and are happy to see him back as needed.    Nicholos Johns, PA-C

## 2017-10-01 ENCOUNTER — Encounter: Payer: Self-pay | Admitting: *Deleted

## 2017-10-01 NOTE — Progress Notes (Signed)
The pt came on 10-01-17 wanted medical records. Already given to the pt.

## 2017-10-11 ENCOUNTER — Ambulatory Visit: Payer: Non-veteran care | Attending: Radiation Oncology | Admitting: Radiation Oncology

## 2017-10-11 ENCOUNTER — Encounter: Payer: Self-pay | Admitting: Radiation Oncology

## 2017-10-11 DIAGNOSIS — C61 Malignant neoplasm of prostate: Secondary | ICD-10-CM | POA: Insufficient documentation

## 2017-10-11 DIAGNOSIS — Z510 Encounter for antineoplastic radiation therapy: Secondary | ICD-10-CM | POA: Diagnosis present

## 2017-10-14 NOTE — Progress Notes (Signed)
  Radiation Oncology         [phone] ________________________________  Name: Dominic White MRN: [id number]  Date: 10/11/2017  DOB: Dec 15, 1950  3D Planning Note   Prostate Brachytherapy Post-Implant Dosimetry  Diagnosis: 67 y.o. gentleman with Stage T1c adenocarcinoma of the prostate with Gleason Score of 3+3, and PSA of 11.1   Narrative: On a previous date, Dominic White returned following prostate seed implantation for post implant planning. He underwent CT scan complex simulation to delineate the three-dimensional structures of the pelvis and demonstrate the radiation distribution.  Since that time, the seed localization, and complex isodose planning with dose volume histograms have now been completed.  Results:   Prostate Coverage - The dose of radiation delivered to the 90% or more of the prostate gland (D90) was 86.67% of the prescription dose. This is within 5% our goal of greater than 90%.  On review of the isodoses, the coverage is excellent in the peripheral zone. Rectal Sparing - The volume of rectal tissue receiving the prescription dose or higher was 0.0 cc. This falls under our thresholds tolerance of 1.0 cc.  Impression: The prostate seed implant appears to show adequate target coverage and appropriate rectal sparing.  Plan:  The patient will continue to follow with urology for ongoing PSA determinations. I would anticipate a high likelihood for local tumor control with minimal risk for rectal morbidity.  ________________________________  Sheral Apley Tammi Klippel, M.D.

## 2017-10-15 ENCOUNTER — Encounter: Payer: Self-pay | Admitting: Medical Oncology

## 2019-08-13 DIAGNOSIS — R5381 Other malaise: Secondary | ICD-10-CM | POA: Diagnosis not present

## 2019-08-13 DIAGNOSIS — N521 Erectile dysfunction due to diseases classified elsewhere: Secondary | ICD-10-CM | POA: Diagnosis not present

## 2019-08-13 DIAGNOSIS — R5383 Other fatigue: Secondary | ICD-10-CM | POA: Diagnosis not present

## 2019-08-13 DIAGNOSIS — Z0000 Encounter for general adult medical examination without abnormal findings: Secondary | ICD-10-CM | POA: Diagnosis not present

## 2019-08-13 DIAGNOSIS — Z8546 Personal history of malignant neoplasm of prostate: Secondary | ICD-10-CM | POA: Diagnosis not present

## 2019-11-04 DIAGNOSIS — H6692 Otitis media, unspecified, left ear: Secondary | ICD-10-CM | POA: Diagnosis not present

## 2019-11-04 DIAGNOSIS — J0100 Acute maxillary sinusitis, unspecified: Secondary | ICD-10-CM | POA: Diagnosis not present

## 2020-04-27 DIAGNOSIS — H903 Sensorineural hearing loss, bilateral: Secondary | ICD-10-CM | POA: Diagnosis not present

## 2020-05-07 DIAGNOSIS — H903 Sensorineural hearing loss, bilateral: Secondary | ICD-10-CM | POA: Diagnosis not present

## 2020-11-29 DIAGNOSIS — R051 Acute cough: Secondary | ICD-10-CM | POA: Diagnosis not present

## 2020-11-29 DIAGNOSIS — J069 Acute upper respiratory infection, unspecified: Secondary | ICD-10-CM | POA: Diagnosis not present

## 2020-11-29 DIAGNOSIS — R0981 Nasal congestion: Secondary | ICD-10-CM | POA: Diagnosis not present

## 2020-11-29 DIAGNOSIS — Z20828 Contact with and (suspected) exposure to other viral communicable diseases: Secondary | ICD-10-CM | POA: Diagnosis not present

## 2020-12-02 DIAGNOSIS — J0100 Acute maxillary sinusitis, unspecified: Secondary | ICD-10-CM | POA: Diagnosis not present

## 2021-06-03 DIAGNOSIS — R051 Acute cough: Secondary | ICD-10-CM | POA: Diagnosis not present

## 2021-06-03 DIAGNOSIS — R070 Pain in throat: Secondary | ICD-10-CM | POA: Diagnosis not present

## 2021-06-03 DIAGNOSIS — R519 Headache, unspecified: Secondary | ICD-10-CM | POA: Diagnosis not present
